# Patient Record
Sex: Female | Born: 1990 | Race: White | Hispanic: No | Marital: Single | Smoking: Never smoker
Health system: Southern US, Community
[De-identification: ages and names within clinical notes are randomized; demographics above are authoritative.]

---

## 2015-07-16 ENCOUNTER — Ambulatory Visit (INDEPENDENT_AMBULATORY_CARE_PROVIDER_SITE_OTHER): Payer: Managed Care, Other (non HMO)

## 2015-07-16 ENCOUNTER — Encounter (INDEPENDENT_AMBULATORY_CARE_PROVIDER_SITE_OTHER): Payer: Self-pay

## 2015-07-16 VITALS — BP 132/90 | HR 81 | Temp 98.2°F | Resp 16 | Ht 68.0 in | Wt 141.3 lb

## 2015-07-16 DIAGNOSIS — R0982 Postnasal drip: Secondary | ICD-10-CM

## 2015-07-16 DIAGNOSIS — J029 Acute pharyngitis, unspecified: Secondary | ICD-10-CM

## 2015-07-16 NOTE — Patient Instructions (Signed)
Salt Water Gargle  This solution will help make your mouth and throat feel better.  HOME CARE INSTRUCTIONS    Mix 1 teaspoon of salt in 8 ounces of warm water.   Gargle with this solution as much or often as you need or as directed. Swish and gargle gently if you have any sores or wounds in your mouth.   Do not swallow this mixture.     This information is not intended to replace advice given to you by your health care provider. Make sure you discuss any questions you have with your health care provider.     Document Released: 01/08/2004 Document Revised: 06/28/2011 Document Reviewed: 05/31/2008  Elsevier Interactive Patient Education 2016 Elsevier Inc.    Sore Throat  A sore throat is a painful, burning, sore, or scratchy feeling of the throat. There may be pain or tenderness when swallowing or talking. You may have other symptoms with a sore throat. These include coughing, sneezing, fever, or a swollen neck. A sore throat is often the first sign of another sickness. These sicknesses may include a cold, flu, strep throat, or an infection called mono. Most sore throats go away without medical treatment.   HOME CARE    Only take medicine as told by your doctor.   Drink enough fluids to keep your pee (urine) clear or pale yellow.   Rest as needed.   Try using throat sprays, lozenges, or suck on hard candy (if older than 4 years or as told).   Sip warm liquids, such as broth, herbal tea, or warm water with honey. Try sucking on frozen ice pops or drinking cold liquids.   Rinse the mouth (gargle) with salt water. Mix 1 teaspoon salt with 8 ounces of water.   Do not smoke. Avoid being around others when they are smoking.   Put a humidifier in your bedroom at night to moisten the air. You can also turn on a hot shower and sit in the bathroom for 5-10 minutes. Be sure the bathroom door is closed.  GET HELP RIGHT AWAY IF:    You have trouble breathing.   You cannot swallow fluids, soft foods, or your spit  (saliva).   You have more puffiness (swelling) in the throat.   Your sore throat does not get better in 7 days.   You feel sick to your stomach (nauseous) and throw up (vomit).   You have a fever or lasting symptoms for more than 2-3 days.   You have a fever and your symptoms suddenly get worse.  MAKE SURE YOU:    Understand these instructions.   Will watch your condition.   Will get help right away if you are not doing well or get worse.     This information is not intended to replace advice given to you by your health care provider. Make sure you discuss any questions you have with your health care provider.     Document Released: 01/13/2008 Document Revised: 12/29/2011 Document Reviewed: 12/12/2011  Elsevier Interactive Patient Education 2016 [employer]    [facility]     [facility] by [facility]  [address]  [place], [place] [id number]  Phone: [phone]  Fax: [phone]  [email or web address]  Twitter [email or web address]   Closed all  Holidays        Attending Caregiver: [facility]    Today's orders:   Orders Placed This Encounter    POCT RAPID STREP A  Prescription(s) E-Rx to:  [facility] - [place], [place] - [address]    Future appts with Student Health  Visit date not found    PLEASE ACTIVATE YOUR [place] MYCHART. THIS IS ONE EASY WAY TO COMMUNICATE WITH [facility].  SEE THE CODE ON THIS FORM FOR ACCESS.    -----------------------------PRIVACY INFORMATION-----------------------------------------------  As a [place] student, regardless of your age, we cannot discuss your personal health information with a parent, spouse, family member or anyone else without your expressed consent.    This policy does not include:  Individuals who would have a legitimate reason to access your records and information to assist in your care under the provisions of HIPAA [facility] Portability and Accountability Act) law;    Individuals with whom you have previously given expressed written consent to do so, such a legal guardian or Power of [address].    No one can access your [facility], unless you give them your account sign on and password. You will receive emails from [facility].  This means anyone who has access to the email account you provided can see this notification. There will be no private medical information included in these emails. This notification of  new medical information  available in your [facility], may be information that you do not want others to know.   _______________________________________________________________________    If your symptoms persist, worsen or you develop any new or concerning symptoms please call [place] [facility] for at [phone] for a follow up appointment.   If your symptoms are severe, go immediately to the emergency department or call 911.  Please check with your health insurance coverage for these types of visits.   Please keep all follow up visit recommended at today's visit.    If you received x-rays during your visit, be aware that the final and formal interpretation of those films by a radiologist will occur after your discharge.  If there is a significant discrepancy identified after your discharge, we will contact you at the telephone number provided during registration.  These results are available for your review on [facility].    Please refer to your [facility] for lab test results. Lab test results related to HIV, STI's and pregnancy are considered private and are therefore not immediately visible in your My[facility], we may still be able to send a generic MyChart message for these results.  If you have cultures pending for sexually transmitted diseases, you will be contacted by phone.     Positive cultures are reported to the [facility] Department of Health, as required by state law. These results are considered private on My[facility] and can only be released by the provider.We  will not contact you if the results are negative.    It is very important that we have a phone number that is the single best way to contact you in the event that we become aware of important clinical information or concerns after your discharge.  If the phone number you provided at registration is NOT this number you should inform staff and registration prior to leaving.     Please call  Student Health at [phone] with any further questions.    Instructions discussed with patient upon discharge by clinical staff with all questions answered.    Parke Simmers, APRN 07/16/2015, 16:57

## 2015-07-16 NOTE — Progress Notes (Addendum)
Attending: Dr. Silvestre Gunner  History of Present Illness: Shelby Elliott is a 25 y.o. female who presents to the Student [facility]/Urgent Care today with chief complaint of    Chief Complaint     Sore Throat             Location: throat  Quality: sore  Onset: 5 days ago  Severity: mild  Timing: constant  Context: Pt presents to Student [facility] with c/o sore throat that began 5 days ago. 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 Pt associates dry cough and sinus drainage. Pt denies fever, chills, myalgias, and otalgia. Pt has taken Dayquil with minimal relief.     I reviewed and confirmed the patient's past medical history taken by the nurse or medical assistant with the addition of the following:    Past Medical History:    History reviewed. No pertinent past medical history.    Past Surgical History:    History reviewed. No pertinent surgical history.    Allergies:  No Known Allergies  Medications:    Current Outpatient Prescriptions   Medication Sig   . DM/PSEUDOEPHED/ACETAMINOPHEN (VICKS DAYQUIL ORAL) Take by mouth     Social History:    Social History   Substance Use Topics   . Smoking status: Never Smoker   . Smokeless tobacco: None   . Alcohol use Yes      Comment: rarely     Family History: No significant family history.  Family History   Problem Relation Age of Onset   . Pancreatic Cancer Father    . Prostate Cancer Paternal Grandfather      Review of Systems:    General: no fever and no myalgias, no chills   ENT:  sore throat and sinus drainage  Pulmonary: dry cough  All other review of systems were negative    Physical Exam:  Vital signs:   Vitals:    07/16/15 1649   BP: 132/90   Pulse: 81   Resp: 16   Temp: 36.8 C (98.2 F)   TempSrc: Thermal Scan   SpO2: 99%   Weight: 64.1 kg (141 lb 4.8 oz)   Height: 1.727 m ( )     Body mass index is 21.48 kg/(m^2). Facility age limit for growth percentiles is 20 years.  Patient's last menstrual period was  07/10/2015.    General:  Well appearing and No acute distress  Head:  Normocephalic  Eyes:  Normal lids/lashes and normal conjunctiva  ENT:  normal EAC's, normal TM's, MMM, normal pharynx/tonsils, scant post nasal drainage, mild cobblestoning, and normal tongue/uvula  Neck:  supple  Pulmonary:  clear to auscultation bilaterally, no wheezes, no rales and no rhonchi  Cardiovascular:  regular rate/rhythm, normal S1/S2 and no murmur/rub/gallop  Skin:  warm/dry and no rash  Psychiatric:  Appropriate affect and behavior  Neurologic:   Alert and oriented x 3    Data Reviewed:      Point-of-care testing:     Rapid Strep: Negative                      Course: Condition at discharge: Good     Differential Diagnosis: strep pharyngitis vs viral pharyngitis vs URI    Assessment:   1. Sore throat    2. PND (post-nasal drip)        Plan:    Orders Placed This Encounter   . POCT RAPID  STREP A         Pt educated on symptomatic care.     Go to Emergency Department immediately for further work up if any concerning symptoms.  Plan was discussed and patient verbalized understanding.  If symptoms are worsening or not improving the patient should return to the Urgent Care/Student Health for further evaluation.    I am scribing for, and in the presence of, Juliane PootBeth Nass, APRN for services provided on 07/16/2015.  Alba DestineKayleigh Garrett, SCRIBE     Alba DestineKayleigh Garrett, SCRIBE 07/16/2015, 17:12    I personally performed the services described in this documentation, as scribed  in my presence, and it is both accurate  and complete.    Parke SimmersBeth M Nass, APRN  The supervising physician was physically present in Student Health/Urgent Care and available for consultation, and did not participate in the care of this patient.    I was available for consult but did not physically examine the patient.  Agree with medical decision making above.  Seward CarolSunjay Salley SlaughterKumar Kelliann Pendergraph, MD  07/16/2015, 17:21

## 2015-12-05 ENCOUNTER — Encounter (FREE_STANDING_LABORATORY_FACILITY)
Admit: 2015-12-05 | Discharge: 2015-12-05 | Disposition: A | Discharge status: Home / Self Care | Payer: Managed Care, Other (non HMO) | Attending: Emergency Medicine | Admitting: Emergency Medicine

## 2015-12-05 ENCOUNTER — Ambulatory Visit (INDEPENDENT_AMBULATORY_CARE_PROVIDER_SITE_OTHER): Payer: Managed Care, Other (non HMO)

## 2015-12-05 ENCOUNTER — Encounter (INDEPENDENT_AMBULATORY_CARE_PROVIDER_SITE_OTHER): Payer: Self-pay

## 2015-12-05 VITALS — BP 118/80 | HR 89 | Temp 97.8°F | Resp 17 | Ht 68.0 in | Wt 145.9 lb

## 2015-12-05 DIAGNOSIS — J039 Acute tonsillitis, unspecified: Secondary | ICD-10-CM

## 2015-12-05 DIAGNOSIS — J029 Acute pharyngitis, unspecified: Principal | ICD-10-CM

## 2015-12-05 MED ORDER — LIDOCAINE/ DIPHENHYDRAMINE/ MAALOX
10.00 mL | ORAL | 0 refills | Status: DC | PRN
Start: 2015-12-05 — End: 2016-03-26

## 2015-12-05 MED ORDER — DEXAMETHASONE 1 MG/ML DROPS (CONCENTRATE)
8.0000 mg | Freq: Once | ORAL | 0 refills | Status: AC
Start: 2015-12-05 — End: 2015-12-05

## 2015-12-05 NOTE — Progress Notes (Signed)
[facility], [facility]  [place] [address]    PATIENT NAME:  Shelby Elliott  MRN:  [id number]  DOB:  October 20, 1990  DATE OF SERVICE: 12/05/2015    Chief Complaint   Patient presents with    Sore Throat    Gland Swelling    Enlarged Tonsils     History of Present Illness: Shelby Elliott is a 25 y.o. female who presents to [facility] today for the above complaint.  HPI    Location: Throat   Quality: Sore  Onset: 3 days ago  Severity: Mild to moderate  Timing: Persistent  Context: Pt reports that she has no Hx of strep throat or mononucleosis. She sts that her sister has a Hx of frequent strep throat, but has not been around her recently. Pt reports that she has had sick contacts with her roommate who has been ill with similar sxs recently.   Modifying factors: She sts that she has taken Ibuprofen which has given her no relief  Associated symptoms: painful swallowing, night sweats, HA and swollen lymph glands    On average, how many days/week do you engage in moderate to vigorous physical activity (like brisk walking)?: 3 Days  On average, how many minutes do you enage in physical activity at this level?: 30-45 Minutes/day  Total activity days/week x minutes/day =: 135    Functional Health Screening:     Patient is under 18:  No   Have you had a recent unexplained weight loss or gain?:  No   Because we are aware of abuse and domestic violence today, we ask all patients: Are you being hurt, hit, or frightened by anyone at your home or in your life?:  No   Do you have any basic needs within your home that are not being met? (such as Food, Shelter, Games developer, Transportation):  No   Patient is under 18 and therefore has no Advance Directives:  No   Patient has:  No Advance   Patient has Advance Directive:  No        PHQ Questionnaire  Little interest or pleasure in doing things.: Not at all  Feeling down, depressed, or hopeless: Not at all  PHQ 2  Total: 0     Fall Risk Assessment       Past Medical History:    History reviewed. No pertinent past medical history.    Past Surgical History:    History reviewed. No pertinent surgical history.    Allergies:  No Known Allergies     Medications:  Outpatient Prescriptions Marked as Taking for the 12/05/15 encounter (Office Visit) with Charlane Ferretti, [facility] Provider   Medication Sig    dexamethasone (DECADRON) 1 mg/mL Oral Drops Take 8 mL (8 mg total) by mouth One time for 1 dose    lidocaine, diphenhydramine, maalox (MAGIC MOUTHWASH) oral solution 10 mL swish and spit Every 2 hours as needed for Sore throat Lidocaine viscous 2%, diphenhydramine 12.5 mg/29m, Maalox.  Mix 1:1:1    oral contraceptive (PATIENT'S OWN SUPPLY) Take 1 Tab by mouth Once a day     Social History:    Social History     Social History    Marital status: Single     Spouse name: N/A    Number of children: N/A    Years of education: N/A     Social History Main Topics    Smoking status: Never Smoker  Smokeless tobacco: Not on file    Alcohol use Yes      Comment: rarely    Drug use: No    Sexual activity: Not on file     Other Topics Concern    Not on file     Social History Narrative     Family History:  Family History   Problem Relation Age of Onset    Pancreatic Cancer Father     Prostate Cancer Paternal Grandfather      Review of Systems:  Review of Systems   Constitutional: Positive for fever (subjective and night sweats). Negative for chills.   HENT: Positive for ear pain and sore throat. Negative for congestion and rhinorrhea.    Eyes: Negative for pain and visual disturbance.   Respiratory: Negative for cough and shortness of breath.    Cardiovascular: Negative for chest pain.   Gastrointestinal: Negative for abdominal pain, constipation, diarrhea, nausea and vomiting.   Genitourinary: Negative.    Musculoskeletal: Negative for arthralgias and myalgias.   Skin: Negative for rash and wound.   Neurological: Positive for headaches.  Negative for dizziness and light-headedness.   Hematological: Positive for adenopathy.      Physical Exam:  Vitals:    12/05/15 1312   BP: 118/80   Pulse: 89   Resp: 17   Temp: 36.6 C (97.8 F)   TempSrc: Thermal Scan   SpO2: 98%   Weight: 66.2 kg (145 lb 15.1 oz)   Height: 1.727 m ('5\' 8"' )     Body mass index is 22.19 kg/(m^2).     Physical Exam   Constitutional: She is oriented to person, place, and time. She appears well-developed and well-nourished. No distress.   HENT:   Head: Normocephalic and atraumatic.   Right Ear: External ear normal.   Left Ear: External ear normal.   Mouth/Throat: Oropharyngeal exudate, posterior oropharyngeal edema and posterior oropharyngeal erythema present.   Eyes: Conjunctivae are normal. Right eye exhibits no discharge. Left eye exhibits no discharge.   Neck: Normal range of motion.   Cardiovascular: Normal rate, regular rhythm and normal heart sounds.  Exam reveals no gallop and no friction rub.    No murmur heard.  Pulmonary/Chest: Effort normal and breath sounds normal. No stridor. No respiratory distress. She has no wheezes. She has no rales.   Abdominal: Soft. Bowel sounds are normal. She exhibits no distension. There is no splenomegaly. There is no tenderness. There is no guarding.   Musculoskeletal: Normal range of motion. She exhibits no edema or tenderness.   Lymphadenopathy:     She has cervical adenopathy.   Neurological: She is alert and oriented to person, place, and time.   Skin: Skin is warm. She is diaphoretic.   Psychiatric: She has a normal mood and affect. Her behavior is normal.   Nursing note and vitals reviewed.    Ortho Exam    Data Reviewed:    Point-of-care testing:     Rapid Strep: Negative  Monospot: Negative                      DDx: viral pharyngitis vs bacterial pharyngitis vs tonsillitis vs mononucleosis    Assessment:   1. Sore throat    2. Tonsillitis      Plan:    Orders Placed This Encounter    THROAT CULTURE, BETA HEMOLYTIC STREPTOCOCCUS     POCT RAPID STREP A    POCT MONO TESTING    dexamethasone (DECADRON)  1 mg/mL Oral Drops    lidocaine, diphenhydramine, maalox (MAGIC MOUTHWASH) oral solution     Medications A-D: Dexamethasone sodium phosphate  Route of Administration: PO  NDC #: 2500-3704-88  LOT #: [id number]  Expiration date: 09/15/17  Manufacturer: [place]  Clinic Supplied: Yes  Comments:: pt tolerated     Pt given Decadron PO in clinic.  Throat sent for culture, will contact with results.  Take Magic Mouthwash as Rx and treat symptomatically with OTC medications.  Increase fluid intake and rest.     Go to Emergency Department immediately for further work up if any concerning symptoms.  Plan was discussed and patient verbalized understanding. If symptoms are worsening or not improving the patient should return to [facility] for further evaluation.     I am scribing for, and in the presence of, Dr. Iona Beard for services provided on 12/05/2015.  Pleas Patricia, SCRIBE     Pleas Patricia, SCRIBE    I personally performed the services described in this documentation, as scribed  in my presence, and it is both accurate  and complete.    Elayne Snare, MD

## 2015-12-05 NOTE — Nursing Note (Signed)
12/05/15 1300   Nursing Procedures / Med Admin   Medications A-D Dexamethasone sodium phosphate   Medication Dose 8mg /418mL   Route of Administration PO   Time of Administration 1336   NDC # [id number])[id number]   LOT # [id number]   Expiration date 09/15/17   Man[facility]urer [place]   Clinic Supplied Yes   Comments: pt tolerated   Initials jmb

## 2015-12-05 NOTE — Patient Instructions (Addendum)
[facility]     Operated by [facility]  [address]  [place], [place] [address]  Phone: [phone]  Fax: [phone]  [email or web address]  Twitter [email or web address]   Closed all  Holidays        Attending Caregiver: [facility]    Today's orders:   Orders Placed This Encounter    THROAT CULTURE, BETA HEMOLYTIC STREPTOCOCCUS    POCT RAPID STREP A    POCT MONO TESTING    dexamethasone (DECADRON) 1 mg/mL Oral Drops    lidocaine, diphenhydramine, maalox (MAGIC MOUTHWASH) oral solution       For sore throat:  - Tea with honey  - Chloraseptic spray  - Salt water gargles.   - Lozenges  - Drink lots of fluids.  - Sleep with a humidifier.       Prescription(s) E-Rx to:  [facility] - [place], [place] - [address]    Future appts with Student Health  Visit date not found    PLEASE ACTIVATE YOUR [place] MYCHART. THIS IS ONE EASY WAY TO COMMUNICATE WITH [facility].  SEE THE CODE ON THIS FORM FOR ACCESS.    -----------------------------PRIVACY INFORMATION-----------------------------------------------  As a [place] student, regardless of your age, we cannot discuss your personal health information with a parent, spouse, family member or anyone else without your expressed consent.    This policy does not include:  Individuals who would have a legitimate reason to access your records and information to assist in your care under the provisions of HIPAA [facility](Health Insurance Portability and Accountability Act) law;   Individuals with whom you have previously given expressed written consent to do so, such a legal guardian or Power of [address].    No one can access your My[facility]Chart, unless you give them your account sign on and password. You will receive emails from MY[facility]Chart.  This means anyone who has access to the email account you provided can see this notification. There will be no private medical information included in these emails. This  notification of  new medical information  available in your My[place]Chart, may be information that you do not want others to know.   _______________________________________________________________________    If your symptoms persist, worsen or you develop any new or concerning symptoms please call [facility] for at [phone] for a follow up appointment.   If your symptoms are severe, go immediately to the emergency department or call 911.  Please check with your health insurance coverage for these types of visits.   Please keep all follow up visit recommended at today's visit.    If you received x-rays during your visit, be aware that the final and formal interpretation of those films by a radiologist will occur after your discharge.  If there is a significant discrepancy identified after your discharge, we will contact you at the telephone number provided during registration.  These results are available for your review on My[facility]Chart.    Please refer to your My[facility]Chart for lab test results. Lab test results related to HIV, STI's and pregnancy are considered private and are therefore not immediately visible in your My[facility]UChart, we may still be able to send a generic MyChart message for these results.  If you have cultures pending for sexually transmitted diseases, you will be contacted by phone.     Positive cultures are reported to the [facility] Department of Health, as required by state law. These results are considered private on My[facility]UChart and can only be  released by the provider.We will not contact you if the results are negative.    It is very important that we have a phone number that is the single best way to contact you in the event that we become aware of important clinical information or concerns after your discharge.  If the phone number you provided at registration is NOT this number you should inform staff and registration prior to leaving.     Please call [facility] at [phone] with any  further questions.    Instructions discussed with patient upon discharge by clinical staff with all questions answered.    Dorann OuSaira Lolita Faulds, MD 12/05/2015, 13:40        Pharyngitis (Sore Throat), Report Pending    Pharyngitis (sore throat) is often due to a virus. It can also be caused by the streptococcus, or strep, bacterium, often called strep throat. Both viral and strep infectionscan cause throat pain that is worse when swallowing, aching all over with headache,and fever. Both types of infections are contagious. They may be spread by coughing, kissing,or touching others after touching your mouth or nose.  A test has been done to find out whetheryou (or your child, if your child is the patient) have strep throat. Call this facility or your healthcare provider if you were not given your test results. If the test ispositivefor strep infection, you will need to takeantibiotic medicines. A prescription can be called into your pharmacy at that time. If the test isnegative,you probably have a viral pharyngitis. Thisdoes notneed to be treated with antibiotics.Until you receive the results of the strep test, you should stay home from work. If your child is being tested, he or she should stay home from school.  Home care   Rest at home. Drink plenty of fluids so you won't get dehydrated.   If the test is positive for strep, don't go towork or school for the first 2 days of taking the antibiotics. After this time, you will not be contagious. You can then return to work or school if you are feeling better.   Take the antibiotic medicinefor the full 10 days, even if you feel better. This is very important to make sure the infection is treated. It is also important to prevent drug-resistant germs from developing.If you were given an antibiotic shot, you won't needmore antibiotics.   For children:Use acetaminophen for fever, fussiness, or discomfort. In infants older than 36 months of age, you may use  ibuprofen instead of acetaminophen.Talk with your child's healthcare provider before giving these medicines if yourchild has chronic liver or kidney disease or ever had a stomach ulcer or GI bleeding. Never give aspirin to a child under 23 years of age who is ill with a fever. It may cause severe liver damage.   For adults:Use acetaminophen or ibuprofen to control pain or fever, unless another medicine was prescribed for this. Talk with your healthcare provider before taking these medicinesif you have chronic liver or kidney disease or ever had a stomach ulcer or GI bleeding.   Use throat lozenges or numbing throat sprays to help reduce pain. Gargling with warm salt water will also help reduce throat pain. For this, dissolve 1/2 teaspoon of salt in 1 glass of warm water. To help soothe a sore throat, children can sip on juice or a popsicle. Children 5 years and older can also suck on a lollipop or hard candy.   Don't eat salty or spicy foods. These can irritate the throat.  Follow-up care  Follow up with your healthcare provider or our staff if you don't get better over the next week.  When to seek medical advice  Call your healthcare provider right away if any of these occur:   Feveras directed by your healthcare provider. For children, seek care if:   Your child is of any age and has repeated fevers above 1071F (40C).   Your child is younger than 322 years of age and has a fever of 100.71F (38C) that continues for more than 1 day.   Your child is 674 years old or older and has a fever of 100.71F (38C) that continues for more than 3 days.   New or worsening ear pain, sinus pain, or headache   Painful lumps in the back of neck   Stiff neck   Lymph nodes are getting larger   Inability to swallow liquids, excessive drooling,or inability to open mouth wide due to throat pain   Signs of dehydration (very dark urine or no urine, sunken eyes, dizziness)   Trouble breathing or noisy breathing   Muffled  voice   New rash   Child appears to be getting sicker  Date Last Reviewed: 07/30/2013   2000-2017 [employer] [address], [place], [place] [address]. All rights reserved. This information is not intended as a substitute for professional medical care. Always follow your healthcare professional's instructions.

## 2015-12-07 LAB — THROAT CULTURE, BETA HEMOLYTIC STREPTOCOCCUS: THROAT CULTURE: NORMAL

## 2016-03-26 ENCOUNTER — Ambulatory Visit (INDEPENDENT_AMBULATORY_CARE_PROVIDER_SITE_OTHER): Payer: Managed Care, Other (non HMO)

## 2016-03-26 ENCOUNTER — Encounter (INDEPENDENT_AMBULATORY_CARE_PROVIDER_SITE_OTHER): Payer: Self-pay

## 2016-03-26 VITALS — BP 118/67 | HR 70 | Temp 99.0°F | Resp 14 | Ht 68.0 in | Wt 146.0 lb

## 2016-03-26 DIAGNOSIS — H109 Unspecified conjunctivitis: Principal | ICD-10-CM

## 2016-03-26 MED ORDER — CIPROFLOXACIN 0.3 % EYE OINTMENT
TOPICAL_OINTMENT | Freq: Three times a day (TID) | OPHTHALMIC | 0 refills | Status: AC
Start: 2016-03-26 — End: 2016-04-02

## 2016-03-26 NOTE — Patient Instructions (Addendum)
[facility]     [facility] by [facility]  [address].  [place], [place] [id number]  Phone: [phone]  Fax: [phone]  [email or web address]  Twitter [email or web address]   Closed all  Holidays        Attending Caregiver: [facility]    Today's orders:   Orders Placed This Encounter    ciprofloxacin (CILOXAN) 0.3 % Ophthalmic Ointment         Prescription(s) E-Rx to:  [facility] - [place], [place] - [address]    Future appts with Student Health  Visit date not found    PLEASE ACTIVATE YOUR [place] MYCHART. THIS IS ONE EASY WAY TO COMMUNICATE WITH [facility].  SEE THE CODE ON THIS FORM FOR ACCESS.    -----------------------------PRIVACY INFORMATION-----------------------------------------------  As a [place] student, regardless of your age, we cannot discuss your personal health information with a parent, spouse, family member or anyone else without your expressed consent.    This policy does not include:  Individuals who would have a legitimate reason to access your records and information to assist in your care under the provisions of HIPAA [facility](Health Insurance Portability and Accountability Act) law;   Individuals with whom you have previously given expressed written consent to do so, such a legal guardian or Power of [address].    No one can access your My[facility]Chart, unless you give them your account sign on and password. You will receive emails from MY[facility]Chart.  This means anyone who has access to the email account you provided can see this notification. There will be no private medical information included in these emails. This notification of  new medical information  available in your My[place]Chart, may be information that you do not want others to know.   _______________________________________________________________________    If your symptoms persist, worsen or you develop any new or concerning symptoms please call [facility] for at [phone] for a follow up appointment.   If your symptoms are severe, go immediately to the emergency department or call 911.  Please check with your health insurance coverage for these types of visits.   Please keep all follow up visit recommended at today's visit.    If you received x-rays during your visit, be aware that the final and formal interpretation of those films by a radiologist will occur after your discharge.  If there is a significant discrepancy identified after your discharge, we will contact you at the telephone number provided during registration.  These results are available for your review on My[facility]Chart.    Please refer to your My[facility]Chart for lab test results. Lab test results related to HIV, STI's and pregnancy are considered private and are therefore not immediately visible in your My[facility]Chart, we may still be able to send a generic MyChart message for these results.  If you have cultures pending for sexually transmitted diseases, you will be contacted by phone.     Positive cultures are reported to the [facility] Department of Health, as required by state law. These results are considered private on My[facility]Chart and can only be released by the provider.We will not contact you if the results are negative.    It is very important that we have a phone number that is the single best way to contact you in the event that we become aware of important clinical information or concerns after your discharge.  If the phone number you provided at registration is NOT this number you should inform staff  and registration prior to leaving.     Please call [facility] at [phone] with any further questions.    Instructions discussed with patient upon discharge by clinical staff with all questions answered.    Evlyn CourierJessica Renee Topaz LakeSethman, PA-C 03/26/2016, 16:27          Ciprofloxacin eye ointment  What is this medicine?  CIPROFLOXACIN (sip roe FLOX a sin) is a quinolone antibiotic. It is used to treat  bacterial eye infections.  How should I use this medicine?  This medicine is only for use in the eye. Follow the directions on the prescription label. Wash hands before and after use. Try not to touch the tip of the tube to anything, even your eye or fingertips.Tilt your head back slightly and pull your lower eyelid down with your index finger to form a pouch. Squeeze the end of the tube to apply a thin layer of the ointment to the inside of the lower eyelid. Close the eye gently to spread the ointment. Your vision may blur for a few minutes. Use your doses at regular intervals. Do not use your medicine more often than directed. Finish the full course that is prescribed even if you think your condition is better. Do not skip doses or stop your medicine early. Talk to your pediatrician regarding the use of this medicine in children. Special care may be needed.  What side effects may I notice from receiving this medicine?  Side effects that you should report to your doctor or health care professional as soon as possible:   allergic reactions like skin rash, itching or hives, swelling of the face, lips, or tongue   blurred vision that does not go away  Side effects that usually do not require medical attention (report to your doctor or health care professional if they continue or are bothersome):   temporary blurred vision   tearing or feeling of something in the eye  What may interact with this medicine?  Interactions are not expected. Do not use any other eye products without telling your doctor or health care professional.  What if I miss a dose?  If you miss a dose, use it as soon as you can. If it is almost time for your next dose, use only that dose. Do not use double or extra doses.  Where should I keep my medicine?  Keep out of the reach of children.  Store at room temperature between 2 and 25 degrees C (36 and 77 degrees F). Throw away any unused medicine after the expiration date.  What should I tell my  health care provider before I take this medicine?  They need to know if you have any of these conditions:   contact lens wearer   an unusual or allergic reaction to ciprofloxacin, other antibiotics or medicines, foods, dyes, or preservatives   pregnant or trying to get pregnant   breast-feeding  What should I watch for while using this medicine?  Tell your doctor or health care professional if your symptoms do not improve in 2 to 3 days or if they get worse.  If your eyes are more sensitive to light, wear sunglasses.  Do not wear contact lenses while you have any signs or symptoms of an eye infection. Ask your doctor or health care professional when you can start wearing your contacts again.  Stop using this medicine immediately if you notice signs of an allergic reaction.  NOTE:This sheet is a summary. It may not  cover all possible information. If you have questions about this medicine, talk to your doctor, pharmacist, or health care provider. Copyright 2017 [employer]
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
## 2016-04-25 ENCOUNTER — Other Ambulatory Visit: Payer: Self-pay

## 2018-05-29 ENCOUNTER — Emergency Department (HOSPITAL_COMMUNITY)
Admission: EM | Admit: 2018-05-29 | Discharge: 2018-05-29 | Disposition: A | Discharge status: Home / Self Care | Payer: BLUE CROSS/BLUE SHIELD | Attending: Emergency Medicine | Admitting: Emergency Medicine

## 2018-05-29 ENCOUNTER — Encounter (HOSPITAL_COMMUNITY): Payer: Self-pay | Admitting: Emergency Medicine

## 2018-05-29 ENCOUNTER — Other Ambulatory Visit: Payer: Self-pay

## 2018-05-29 ENCOUNTER — Emergency Department (HOSPITAL_COMMUNITY): Payer: BLUE CROSS/BLUE SHIELD

## 2018-05-29 DIAGNOSIS — M791 Myalgia, unspecified site: Secondary | ICD-10-CM | POA: Diagnosis not present

## 2018-05-29 DIAGNOSIS — M7918 Myalgia, other site: Secondary | ICD-10-CM

## 2018-05-29 LAB — POC URINE PREG, ED: Preg Test, Ur: NEGATIVE

## 2018-05-29 MED ORDER — ACETAMINOPHEN 325 MG PO TABS
650.0000 mg | ORAL_TABLET | Freq: Once | ORAL | Status: AC
  Administered 2018-05-29: 650 mg via ORAL
  Filled 2018-05-29: qty 2

## 2018-05-29 NOTE — ED Notes (Signed)
Patient verbalizes understanding of discharge instructions. Opportunity for questioning and answers were provided. Armband removed by staff, pt discharged from ED. Pt wheeled to lobby. 

## 2018-05-29 NOTE — ED Notes (Signed)
ED Provider at bedside. 
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
